# Patient Record
Sex: Male | Born: 2000
Health system: Southern US, Community
[De-identification: ages and names within clinical notes are randomized; demographics above are authoritative.]

## PROBLEM LIST (undated history)

## (undated) DIAGNOSIS — K219 Gastro-esophageal reflux disease without esophagitis: Secondary | ICD-10-CM

## (undated) DIAGNOSIS — J302 Other seasonal allergic rhinitis: Secondary | ICD-10-CM

## (undated) HISTORY — DX: Gastro-esophageal reflux disease without esophagitis: K21.9

## (undated) HISTORY — DX: Other seasonal allergic rhinitis: J30.2

---
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
## 2020-11-06 IMAGING — DX PORTABLE CHEST - 1 VIEW
1 series · 1 of 1 positions shown · non-contrast
Comparison: None.

CLINICAL DATA: Chest pain and cough for 3 days. Unexplained weight
loss.

EXAM:
PORTABLE CHEST 1 VIEW

[chest ap]
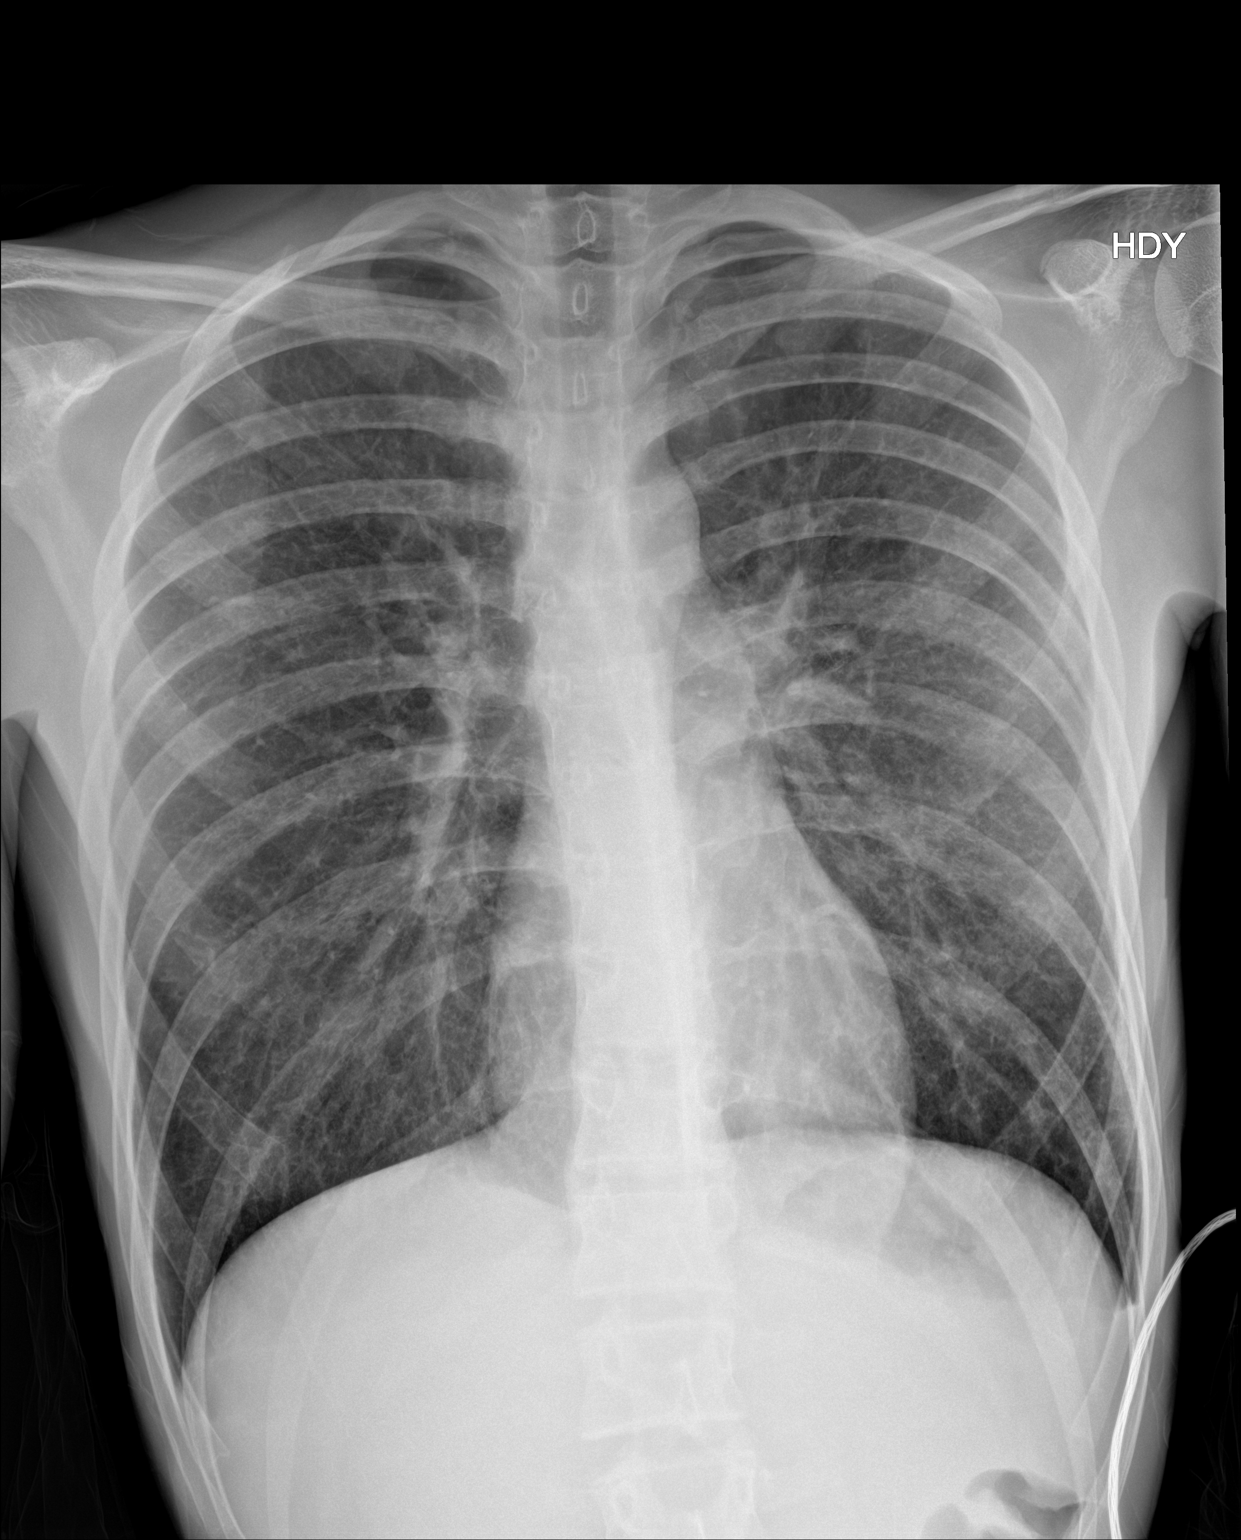

[1 of 1 positions shown; findings below may reference images not displayed]

FINDINGS: The heart size and mediastinal contours are within normal limits.
Both lungs are clear. No evidence of pneumothorax or pleural
effusion. The visualized skeletal structures are unremarkable.
IMPRESSION: Negative.  No active disease.

## 2020-11-06 IMAGING — CT CT ABDOMEN AND PELVIS WITH CONTRAST
2 of 4 series · 16 of 46 positions shown, 18 images · IV contrast (APPLIED)
Comparison: Radiographs from 02/28/2014

CLINICAL DATA: Nausea and vomiting for 1 week.  Weight loss.

EXAM:
CT ABDOMEN AND PELVIS WITH CONTRAST
TECHNIQUE: Multidetector CT imaging of the abdomen and pelvis was performed
using the standard protocol following bolus administration of
intravenous contrast.
CONTRAST:  75mL OMNIPAQUE IOHEXOL 300 MG/ML  SOLN

[Series 2: routine abd/pel with · axial · 0.65mm/px · z∈[-448,-62]mm · 13 of 85 slices shown, 15 images]
[im 4/85  soft-tissue]
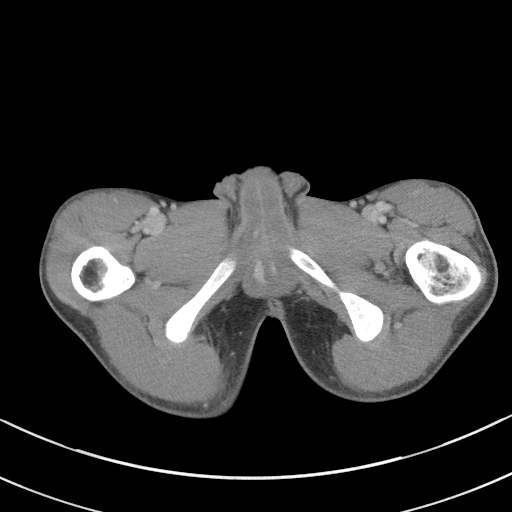
[im 4/85  bone]
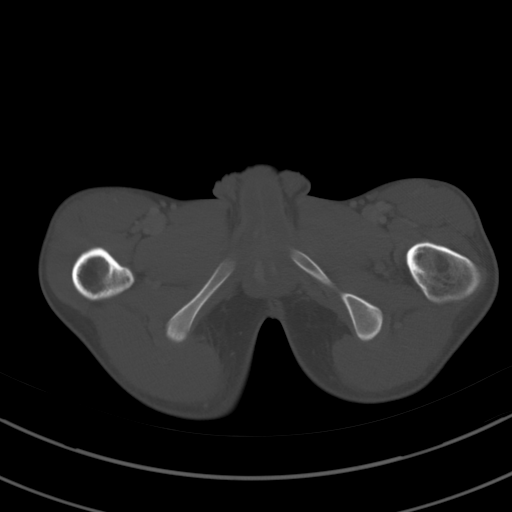
[im 11/85  soft-tissue]
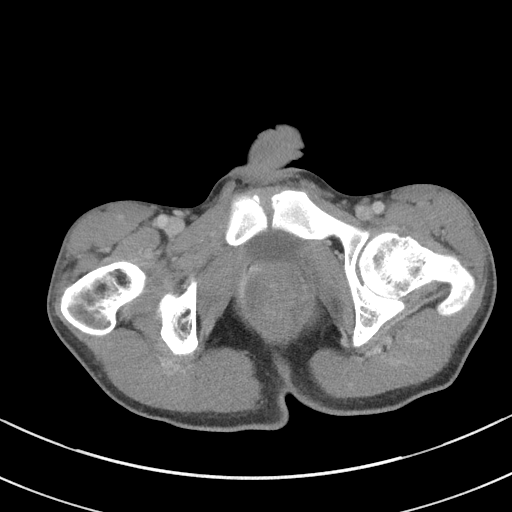
[im 17/85  soft-tissue]
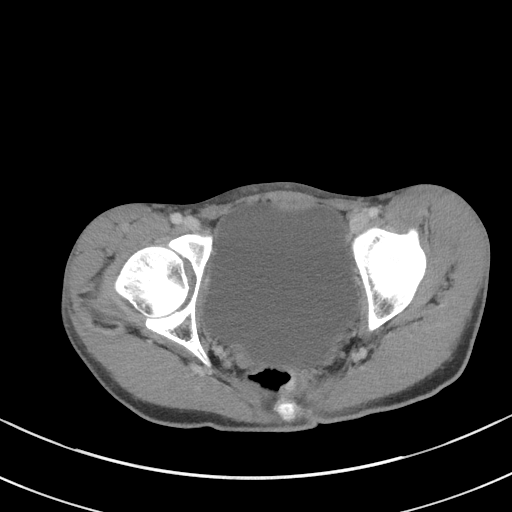
[im 24/85  soft-tissue]
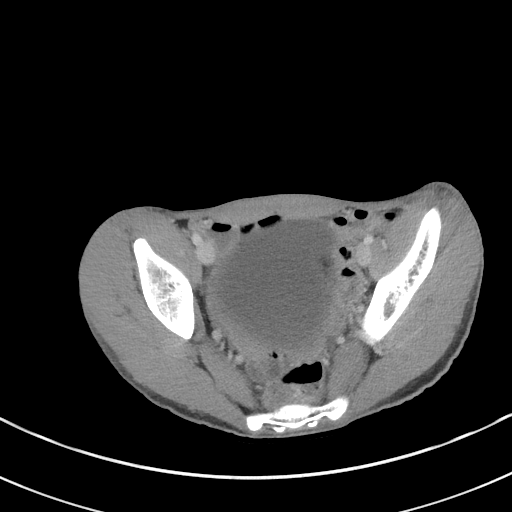
[im 31/85  soft-tissue]
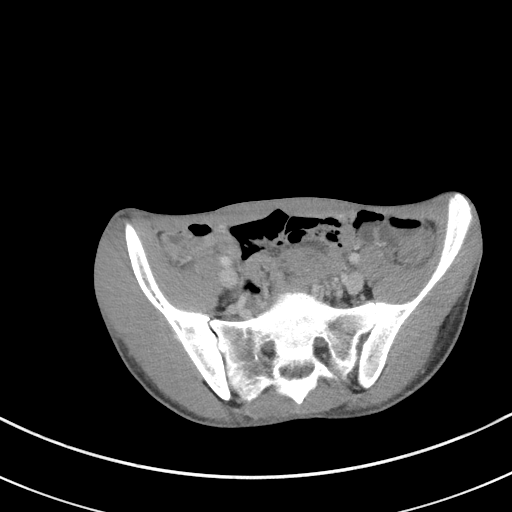
[im 37/85  soft-tissue]
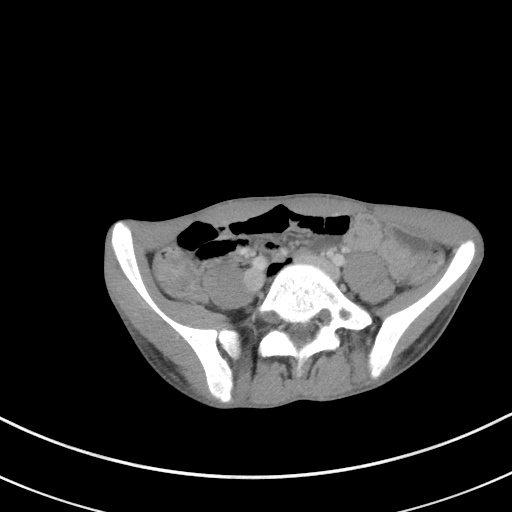
[im 44/85  soft-tissue]
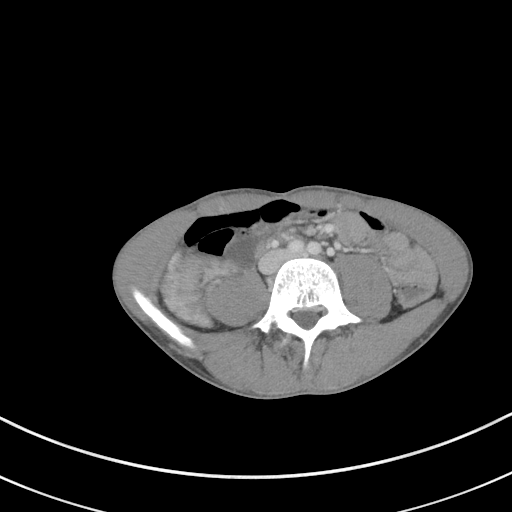
[im 48/85  soft-tissue]
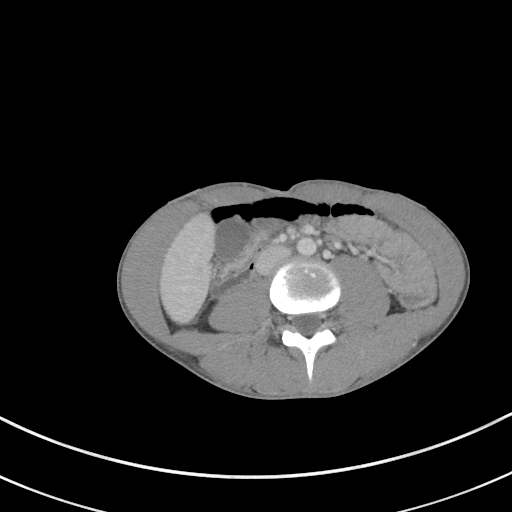
[im 54/85  soft-tissue]
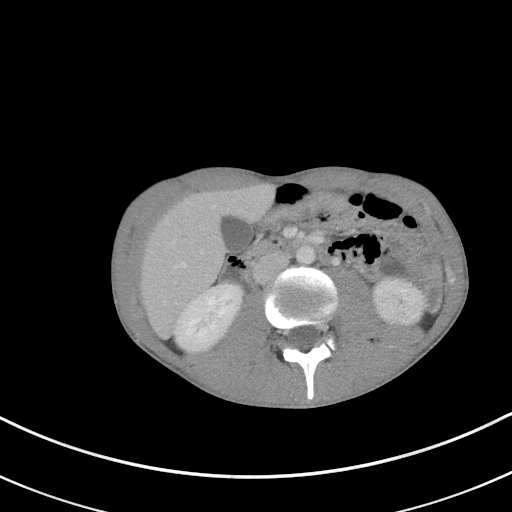
[im 54/85  bone]
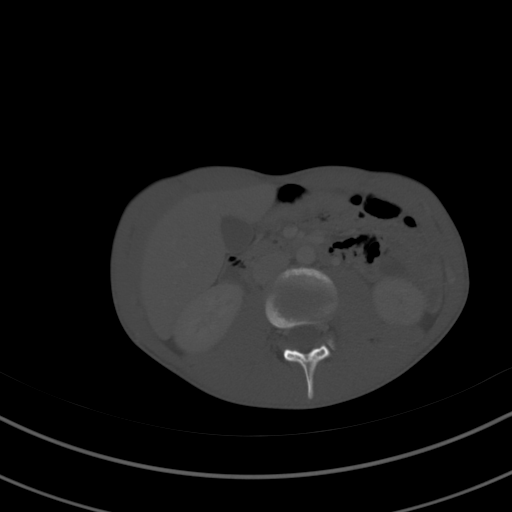
[im 61/85  soft-tissue]
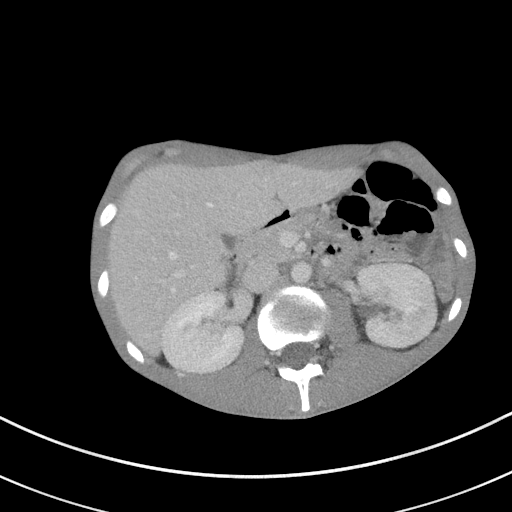
[im 68/85  soft-tissue]
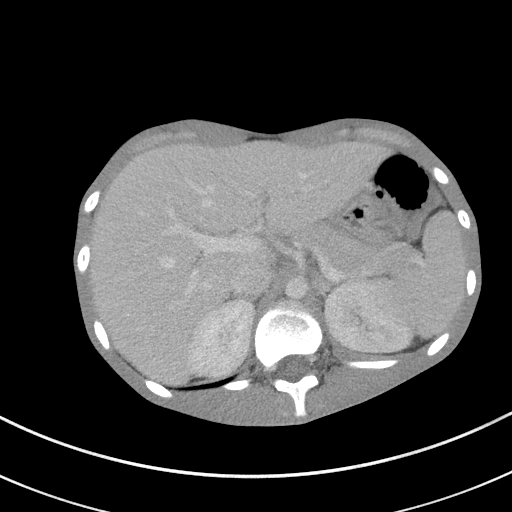
[im 74/85  soft-tissue]
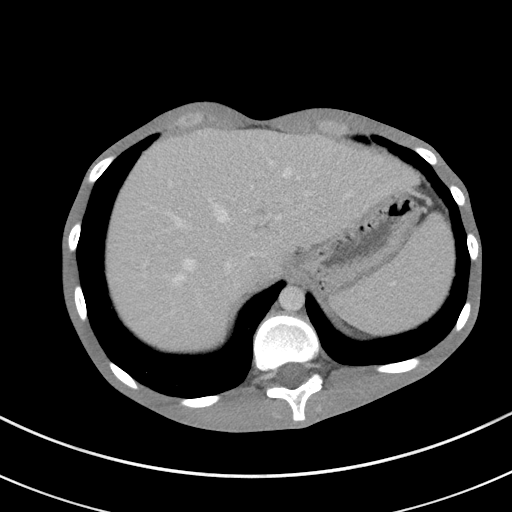
[im 81/85  soft-tissue]
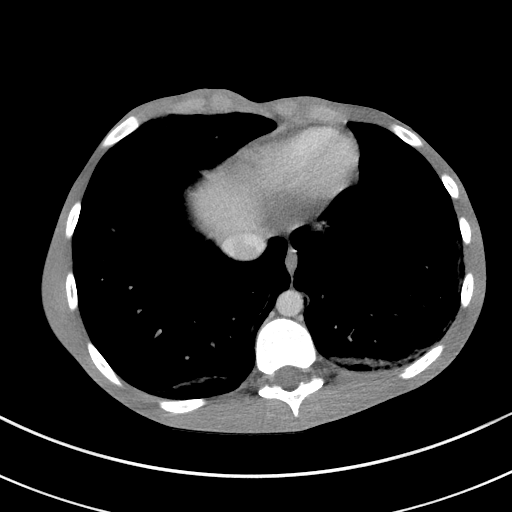

[Series 5: coronal st · coronal · 0.59mm/px · 3 of 74 slices shown]
[im 25/74  soft-tissue]
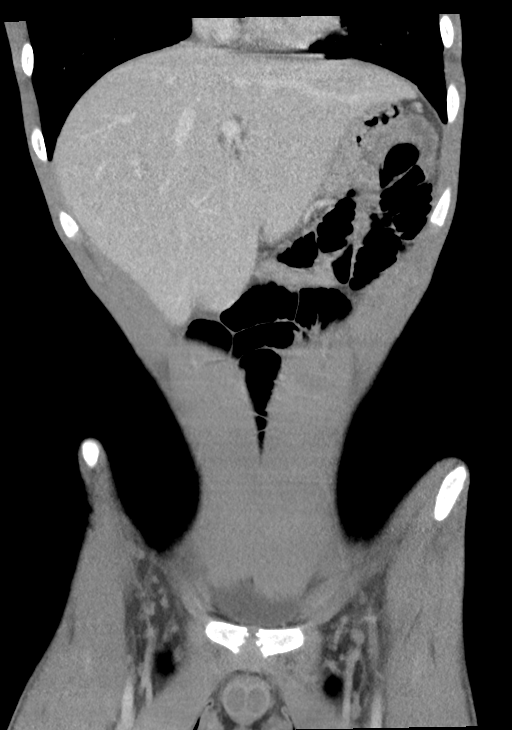
[im 33/74  soft-tissue]
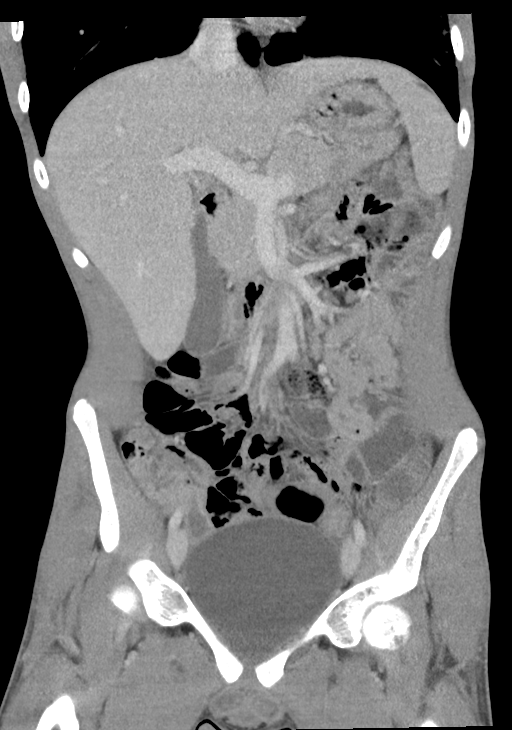
[im 41/74  soft-tissue]
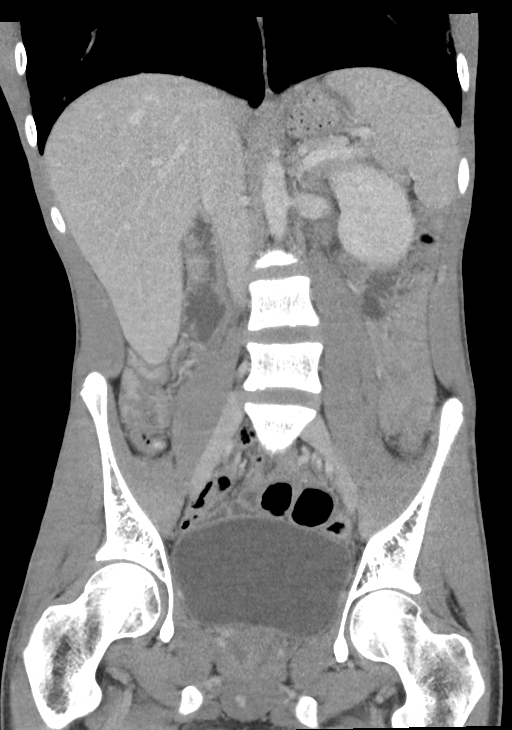

[16 of 46 positions shown; findings below may reference images not displayed]

FINDINGS: Lower chest: Bandlike dependent atelectasis in the lower lobes along
with some subpleural bandlike opacity both lung bases

Hepatobiliary: Unremarkable

Pancreas: Unremarkable

Spleen: Unremarkable

Adrenals/Urinary Tract: Unremarkable

Stomach/Bowel: Air-fluid levels in nondilated proximal loops of
small bowel. The appendix not confidently identified. No dilated
bowel noted.

Vascular/Lymphatic: Unremarkable

Reproductive: Unremarkable

Other: No supplemental non-categorized findings.

Musculoskeletal: Unremarkable
IMPRESSION: 1. Air-fluid levels in nondilated proximal loops of small bowel
which could be from a proximal enteritis. No overt bowel dilatation.
The appendix is not well visualized.
2. Subpleural bands of opacity bilaterally at the lung bases, most
confluent dependently, otherwise with ground-glass opacity. Although
subpleural atelectasis is a possibility, a similar pattern can be
seen in the setting of nonspecific interstitial pneumonia (NSIP).
